# Patient Record
Sex: Female | Born: 1945 | Race: White | Hispanic: No | Marital: Married | Smoking: Never smoker
Health system: Southern US, Community
[De-identification: ages and names within clinical notes are randomized; demographics above are authoritative.]

---

## 2022-03-13 ENCOUNTER — Ambulatory Visit (INDEPENDENT_AMBULATORY_CARE_PROVIDER_SITE_OTHER): Payer: Medicare Other

## 2022-03-13 ENCOUNTER — Other Ambulatory Visit: Payer: Self-pay

## 2022-03-13 ENCOUNTER — Encounter (HOSPITAL_COMMUNITY): Payer: Self-pay | Admitting: Emergency Medicine

## 2022-03-13 ENCOUNTER — Ambulatory Visit (HOSPITAL_COMMUNITY)
Admission: EM | Admit: 2022-03-13 | Discharge: 2022-03-13 | Disposition: A | Discharge status: Home / Self Care | Payer: Medicare Other | Attending: Physician Assistant | Admitting: Physician Assistant

## 2022-03-13 DIAGNOSIS — W19XXXA Unspecified fall, initial encounter: Secondary | ICD-10-CM

## 2022-03-13 DIAGNOSIS — R0782 Intercostal pain: Secondary | ICD-10-CM | POA: Diagnosis not present

## 2022-03-13 DIAGNOSIS — R0781 Pleurodynia: Secondary | ICD-10-CM | POA: Diagnosis not present

## 2022-03-13 DIAGNOSIS — M545 Low back pain, unspecified: Secondary | ICD-10-CM | POA: Diagnosis not present

## 2022-03-13 NOTE — ED Triage Notes (Signed)
Pt reports a fall today at a museum. States she now has left hip pain, back pain and right side pain.  Also reports she hit her head but denies LOC and headache.  ?

## 2022-03-13 NOTE — Discharge Instructions (Signed)
Your x-rays were normal but did show that you have some thinning bones.  Please continue your vitamin D and calcium supplement follow-up with your PCP.  Use over-the-counter medication.  Use heat and stretch for symptom relief.  As we discussed, if you have any headache, dizziness, confusion, nausea, vomiting you need to go to the emergency room immediately for head CT. ?

## 2022-03-13 NOTE — ED Provider Notes (Signed)
?[facility] ? ? ? ?CSN: [id number] ?Arrival date & time: 03/13/22  1629 ? ? ?  ? ?History   ?Chief Complaint ?Chief Complaint  ?Patient presents with  ? Fall  ? ? ?HPI ?Tammy Davis is a 76 y.o. female.  ? ?Patient presents today companied by her husband to help for the majority of history.  Reports that [place] the [facility] [place] 3:30 PM when she took a misstep causing her to fall.  She did hit her head but denies any loss of consciousness, headache, dizziness, visual change, nausea, vomiting, amnesia surrounding event.  She takes 81 mg aspirin but does not take any other blood thinning medication.  She reports since that time she has had ongoing right rib pain and lower back pain.  Denies any bowel/bladder incontinence, lower extremity weakness, saddle anesthesia.  She denies previous injury to back or previous spinal surgery.  Denies any numbness or paresthesias.  She has not tried any over-the-counter medication for symptom management.  She denies history of malignancy or osteoporosis. ? ? ?History reviewed. No pertinent past medical history. ? ?There are no problems to display for this patient. ? ? ?History reviewed. No pertinent surgical history. ? ?OB History   ?No obstetric history on file. ?  ? ? ? ?Home Medications   ? ?Prior to Admission medications   ?Not on File  ? ? ?Family History ?History reviewed. No pertinent family history. ? ?Social History ?Social History  ? ?Tobacco Use  ? Smoking status: Never  ? Smokeless tobacco: Never  ? ? ? ?Allergies   ?Bacitracin and Sulfamethoxazole-trimethoprim ? ? ?Review of Systems ?Review of Systems  ?Constitutional:  Positive for activity change. Negative for appetite change, fatigue and fever.  ?Eyes:  Negative for photophobia and visual disturbance.  ?Respiratory:  Negative for cough and shortness of breath.   ?Cardiovascular:  Negative for chest pain.  ?Gastrointestinal:  Negative for abdominal pain, diarrhea, nausea and vomiting.   ?Musculoskeletal:  Positive for back pain. Negative for arthralgias and myalgias.  ?Neurological:  Positive for speech difficulty (Chronic aphasia). Negative for dizziness, seizures, syncope, facial asymmetry, weakness, light-headedness, numbness and headaches.  ? ? ?Physical Exam ?Triage Vital Signs ?ED Triage Vitals  ?Enc Vitals Group  ?   BP 03/13/22 1819 (!) 161/80  ?   Pulse Rate 03/13/22 1819 80  ?   Resp 03/13/22 1819 16  ?   Temp 03/13/22 1819 98 ?F (36.7 ?C)  ?   Temp Source 03/13/22 1819 Oral  ?   SpO2 03/13/22 1819 100 %  ?   Weight 03/13/22 1816 145 lb (65.8 kg)  ?   Height 03/13/22 1816 5' 3.5" (1.613 m)  ?   Head Circumference --   ?   Peak Flow --   ?   Pain Score 03/13/22 1815 5  ?   Pain Loc --   ?   Pain Edu? --   ?   Excl. in GC? --   ? ?No data found. ? ?Updated Vital Signs ?BP (!) 161/80 (BP Location: Left Arm)   Pulse 80   Temp 98 ?F (36.7 ?C) (Oral)   Resp 16   Ht 5' 3.5" (1.613 m)   Wt 145 lb (65.8 kg)   SpO2 100%   BMI 25.28 kg/m?  ? ?Visual Acuity ?Right Eye Distance:   ?Left Eye Distance:   ?Bilateral Distance:   ? ?Right Eye Near:   ?Left Eye Near:    ?Bilateral Near:    ? ?  Physical Exam ?Vitals reviewed.  ?Constitutional:   ?   General: She is awake. She is not in acute distress. ?   Appearance: Normal appearance. She is well-developed. She is not ill-appearing.  ?   Comments: Very pleasant female appears stated age in no acute distress sitting comfortably in exam room  ?HENT:  ?   Head: Normocephalic and atraumatic. No raccoon eyes, Battle's sign or contusion.  ?   Right Ear: Tympanic membrane, ear canal and external ear normal. No hemotympanum.  ?   Left Ear: Tympanic membrane, ear canal and external ear normal. No hemotympanum.  ?   Nose: Nose normal.  ?   Mouth/Throat:  ?   Tongue: Tongue does not deviate from midline.  ?   Pharynx: Uvula midline. No oropharyngeal exudate or posterior oropharyngeal erythema.  ?Eyes:  ?   Extraocular Movements: Extraocular movements intact.  ?    Conjunctiva/sclera: Conjunctivae normal.  ?   Pupils: Pupils are equal, round, and reactive to light.  ?Cardiovascular:  ?   Rate and Rhythm: Normal rate and regular rhythm.  ?   Heart sounds: Normal heart sounds, S1 normal and S2 normal. No murmur heard. ?Pulmonary:  ?   Effort: Pulmonary effort is normal.  ?   Breath sounds: Normal breath sounds. No wheezing, rhonchi or rales.  ?   Comments: Clear to auscultation bilaterally ?Chest:  ?   Chest wall: Tenderness present. No deformity or swelling.  ?   Comments: Tenderness over right lateral ribs ?Abdominal:  ?   General: Bowel sounds are normal.  ?   Palpations: Abdomen is soft.  ?   Tenderness: There is no abdominal tenderness.  ?Musculoskeletal:  ?   Cervical back: Normal range of motion and neck supple. No tenderness or bony tenderness. No spinous process tenderness or muscular tenderness.  ?   Thoracic back: No tenderness or bony tenderness.  ?   Lumbar back: Tenderness and bony tenderness present.  ?   Comments: Strength 5/5 bilateral upper and lower extremities  ?Lymphadenopathy:  ?   Head:  ?   Right side of head: No submental, submandibular or tonsillar adenopathy.  ?   Left side of head: No submental, submandibular or tonsillar adenopathy.  ?Neurological:  ?   General: No focal deficit present.  ?   Mental Status: She is alert and oriented to person, place, and time.  ?   Cranial Nerves: Cranial nerves 2-12 are intact.  ?   Motor: Motor function is intact.  ?   Coordination: Coordination is intact.  ?   Gait: Gait is intact.  ?Psychiatric:     ?   Behavior: Behavior is cooperative.  ? ? ? ?UC Treatments / Results  ?Labs ?(all labs ordered are listed, but only abnormal results are displayed) ?Labs Reviewed - No data to display ? ?EKG ? ? ?Radiology ?DG Ribs Unilateral W/Chest Right ? ?Result Date: 03/13/2022 ?CLINICAL DATA:  Fall EXAM: RIGHT RIBS AND CHEST - 3+ VIEW COMPARISON:  None. FINDINGS: No displaced fracture involving the ribs. There is no evidence  of pneumothorax or pleural effusion. Both lungs are clear. Heart size and mediastinal contours are within normal limits. IMPRESSION: Negative. Electronically Signed   By: Wiliam KeAlison  Vasan M.D.   On: 03/13/2022 19:21  ? ?DG Lumbar Spine Complete ? ?Result Date: 03/13/2022 ?CLINICAL DATA:  Fall and back pain. EXAM: LUMBAR SPINE - COMPLETE 4+ VIEW COMPARISON:  None. FINDINGS: Five lumbar type vertebra. There is no acute fracture or  subluxation of the lumbar spine. The bones are osteopenic. Multilevel degenerative changes and lower lumbar facet arthropathy. The visualized posterior elements are intact. There is atherosclerotic calcification of the abdominal aorta. Right upper quadrant cholecystectomy clips. IMPRESSION: No acute/traumatic lumbar spine pathology. Electronically Signed   By: Elgie Collard M.D.   On: 03/13/2022 19:23   ? ?Procedures ?Procedures (including critical care time) ? ?Medications Ordered in UC ?Medications - No data to display ? ?Initial Impression / Assessment and Plan / UC Course  ?I have reviewed the triage vital signs and the nursing notes. ? ?Pertinent labs & imaging results that were available during my care of the patient were reviewed by me and considered in my medical decision making (see chart for details). ? ?  ? ?Discussed that based on [place] CT she would qualify for head CT based on age alone.  Patient is confident that she did not have a significant head injury and reports that she is feeling well.  She is accompanied by her husband who will monitor her closely but discussed that if she has any symptoms she needs to go to the ER.  X-rays of lumbar spine and ribs were obtained which showed no osseous abnormalities but did show osteopenia.  Discussed this with patient and encouraged her to continue vitamin D and calcium supplements as prescribed by PCP.  She can use over-the-counter medication as well as heat for symptom relief.  Discussed that if she develops any worsening symptoms  she needs to be evaluated immediately.  Strict return precaution given to which she expressed understanding. ? ?Final Clinical Impressions(s) / UC Diagnoses  ? ?Final diagnoses:  ?Rib pain on right side  ?Lumbar back

## 2023-04-08 IMAGING — DX DG LUMBAR SPINE COMPLETE 4+V
5 series · 5 of 5 positions shown · non-contrast
Comparison: None.

CLINICAL DATA: Fall and back pain.

EXAM:
LUMBAR SPINE - COMPLETE 4+ VIEW

[l-spine ap]
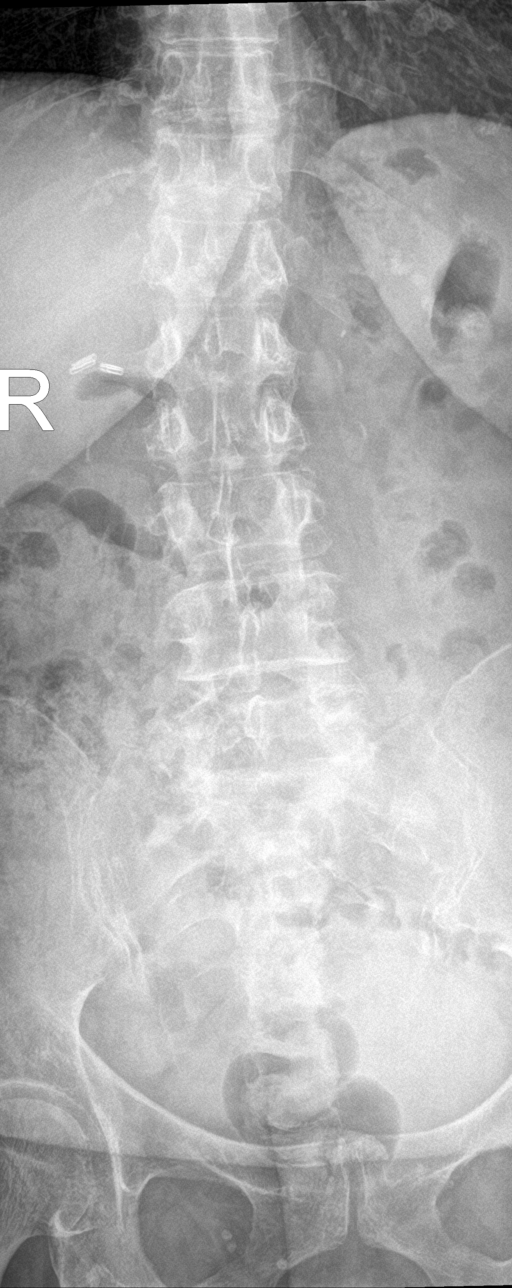

[l-spine obl (1 of 2)]
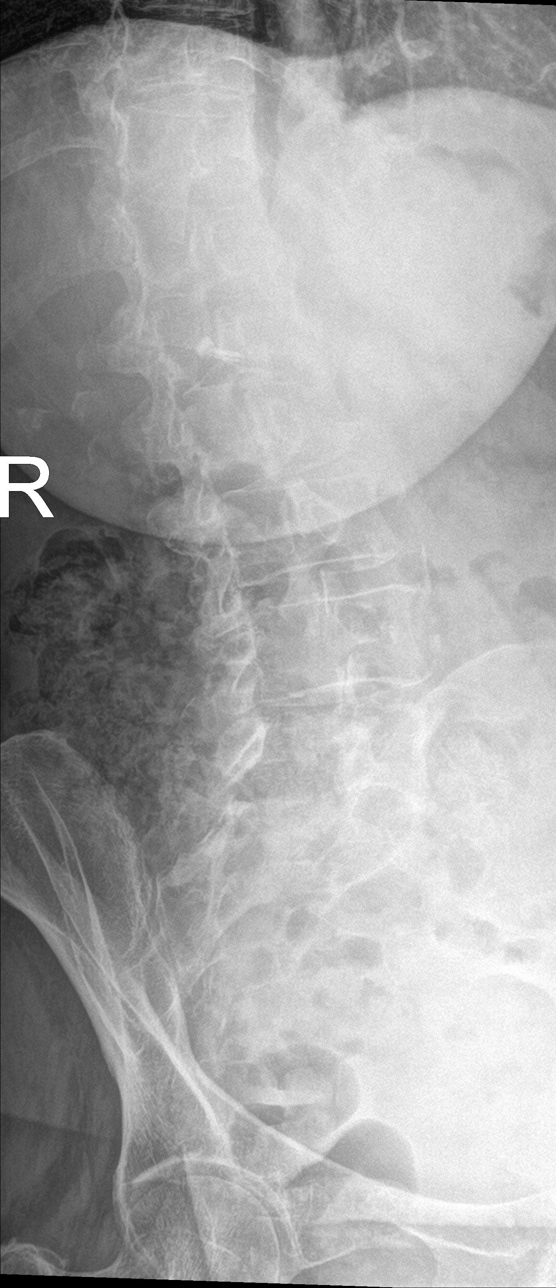

[l-spine obl (2 of 2)]
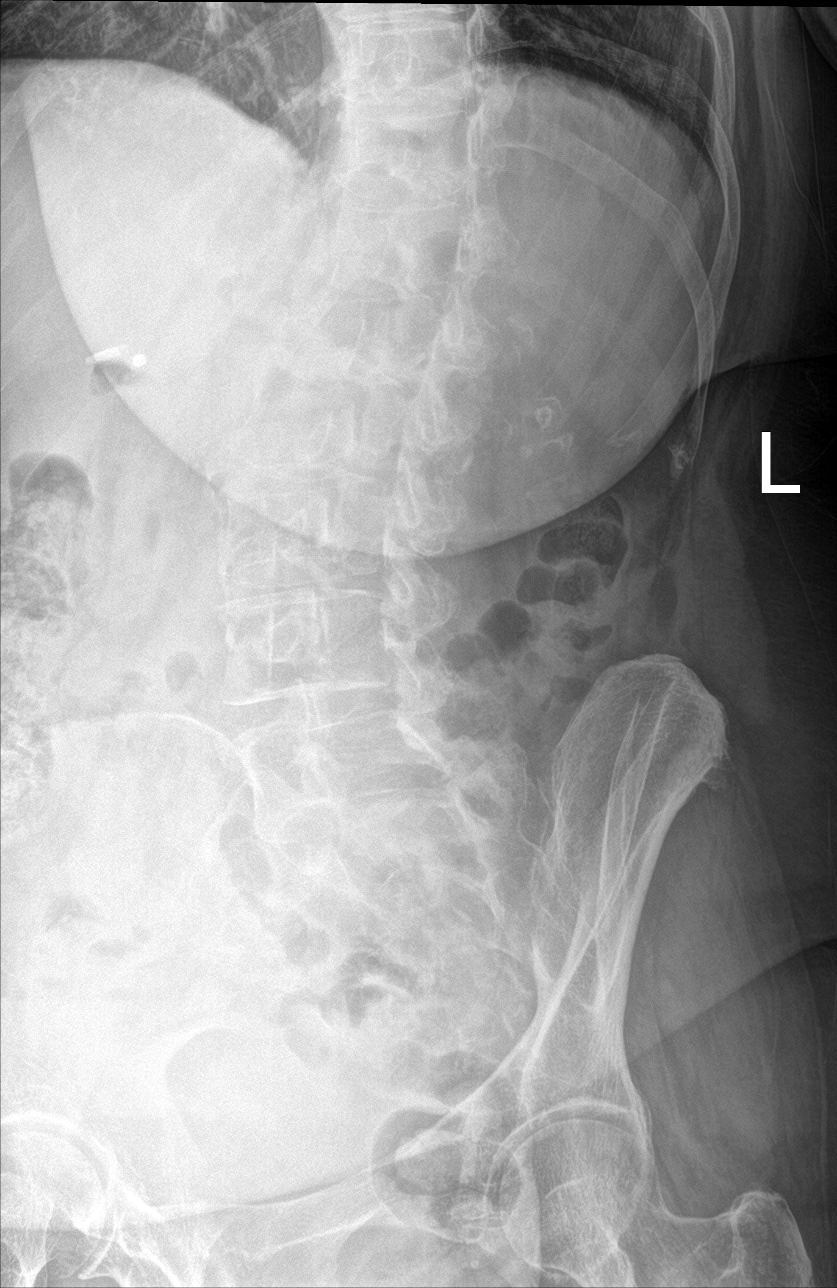

[l-spine lat (1 of 2)]
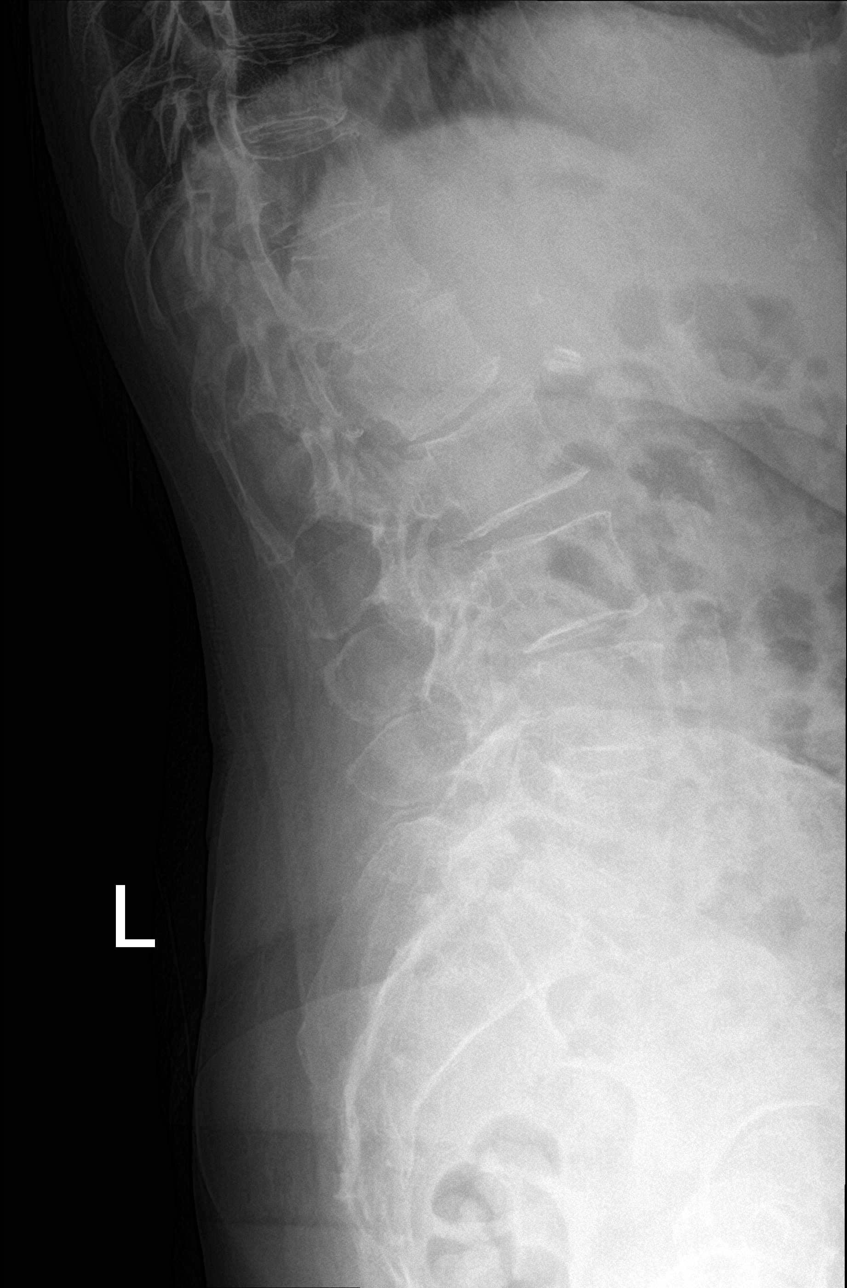

[l-spine lat (2 of 2)]
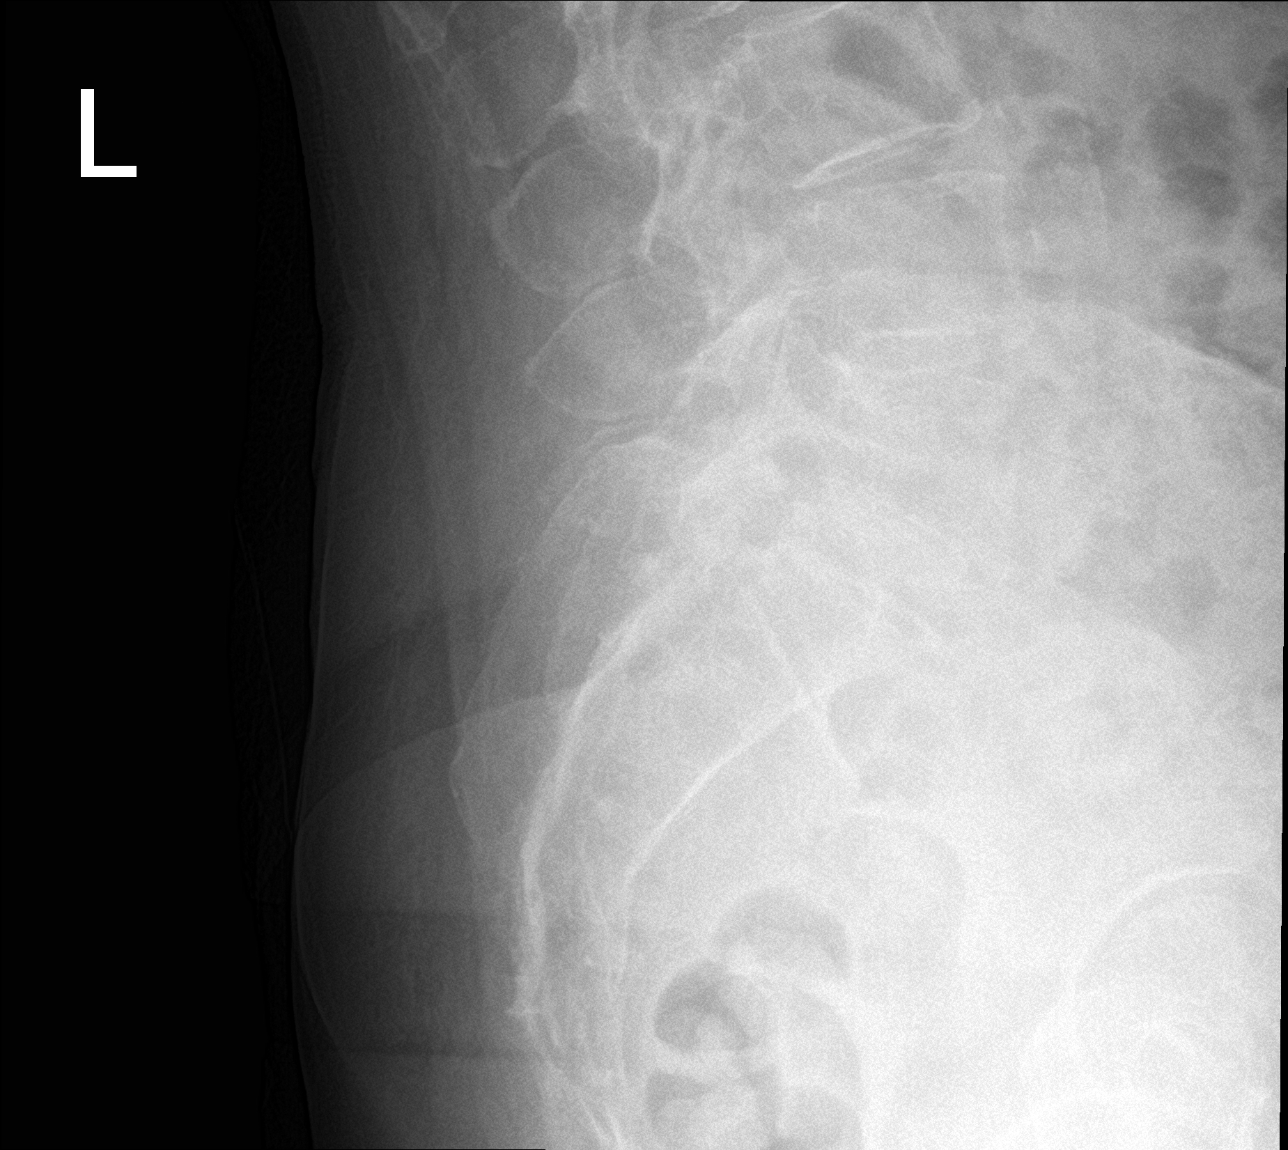

[5 of 5 positions shown; findings below may reference images not displayed]

FINDINGS: Five lumbar type vertebra. There is no acute fracture or subluxation
of the lumbar spine. The bones are osteopenic. Multilevel
degenerative changes and lower lumbar facet arthropathy. The
visualized posterior elements are intact. There is atherosclerotic
calcification of the abdominal aorta. Right upper quadrant
cholecystectomy clips.
IMPRESSION: No acute/traumatic lumbar spine pathology.
